# Patient Record
Sex: Male | Born: 1995 | Hispanic: Yes | Marital: Single | Smoking: Never smoker
Health system: Southern US, Community
[De-identification: ages and names within clinical notes are randomized; demographics above are authoritative.]

---
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
## 2020-11-19 ENCOUNTER — Ambulatory Visit: Payer: Medicaid Other

## 2020-11-26 ENCOUNTER — Ambulatory Visit: Payer: Medicaid Other

## 2020-11-29 ENCOUNTER — Other Ambulatory Visit: Payer: Self-pay

## 2020-11-29 ENCOUNTER — Encounter: Payer: Self-pay | Admitting: Physician Assistant

## 2020-11-29 ENCOUNTER — Ambulatory Visit: Payer: Self-pay | Admitting: Physician Assistant

## 2020-11-29 DIAGNOSIS — Z113 Encounter for screening for infections with a predominantly sexual mode of transmission: Secondary | ICD-10-CM

## 2020-11-29 DIAGNOSIS — Z202 Contact with and (suspected) exposure to infections with a predominantly sexual mode of transmission: Secondary | ICD-10-CM

## 2020-11-29 LAB — GRAM STAIN

## 2020-11-29 LAB — HM HIV SCREENING LAB: HM HIV Screening: NEGATIVE

## 2020-11-29 MED ORDER — CEFTRIAXONE SODIUM 500 MG IJ SOLR
500.0000 mg | Freq: Once | INTRAMUSCULAR | Status: AC
  Administered 2020-11-29: 500 mg via INTRAMUSCULAR

## 2020-11-29 MED ORDER — DOXYCYCLINE HYCLATE 100 MG PO TABS: 100.0000 mg | ORAL_TABLET | Freq: Two times a day (BID) | ORAL | 0 refills | Status: AC

## 2020-11-29 NOTE — Progress Notes (Signed)
[facility] Department STI clinic/screening visit  Subjective:  Brent Nelson is a 25 y.o. male being seen today for an STI screening visit. The patient reports they do not have symptoms.    Patient has the following medical conditions:  There are no problems to display for this patient.    Chief Complaint  Patient presents with   SEXUALLY TRANSMITTED DISEASE    screening    HPI  Patient reports patient states that he is not having any symptoms but is a contact to Gonorrhea.  Denies chronic conditions, surgeries and regular medicines.  Reports last HIV test was in 2020 and last void prior to sample collection for Gram stain was about 1.5 hr  ago.    Screening for MPX risk: Does the patient have an unexplained rash? No Is the patient MSM? No Does the patient endorse multiple sex partners or anonymous sex partners? No Did the patient have close or sexual contact with a person diagnosed with MPX? No Has the patient traveled outside the [place] where MPX is endemic? No Is there a high clinical suspicion for MPX-- evidenced by one of the following No  -Unlikely to be chickenpox  -Lymphadenopathy  -Rash that present in same phase of evolution on any given body part   See flowsheet for further details and programmatic requirements.    The following portions of the patient's history were reviewed and updated as appropriate: allergies, current medications, past medical history, past social history, past surgical history and problem list.  Objective:  There were no vitals filed for this visit.  Physical Exam Constitutional:      General: He is not in acute distress.    Appearance: Normal appearance.  HENT:     Head: Normocephalic and atraumatic.     Comments: No nits,lice, or hair loss. No cervical, supraclavicular or axillary adenopathy.     Mouth/Throat:     Mouth: Mucous membranes are moist.     Pharynx: Oropharynx is clear. No oropharyngeal exudate or  posterior oropharyngeal erythema.  Eyes:     Conjunctiva/sclera: Conjunctivae normal.  Pulmonary:     Effort: Pulmonary effort is normal.  Abdominal:     Palpations: Abdomen is soft. There is no mass.     Tenderness: There is no abdominal tenderness. There is no guarding or rebound.  Genitourinary:    Penis: Normal.      Testes: Normal.     Comments: Pubic area without nits, lice, hair loss, edema, erythema, lesions and inguinal adenopathy. Penis circumcised without rash, lesions and discharge at meatus. Testicles descended bilaterally,nt, no masses or edema.  Musculoskeletal:     Cervical back: Neck supple. No tenderness.  Skin:    General: Skin is warm and dry.     Findings: No bruising, erythema, lesion or rash.  Neurological:     Mental Status: He is alert and oriented to person, place, and time.  Psychiatric:        Mood and Affect: Mood normal.        Behavior: Behavior normal.        Thought Content: Thought content normal.        Judgment: Judgment normal.      Assessment and Plan:  Puneet Masoner is a 25 y.o. male presenting to the [facility] Department for STI screening  1. Screening for STD (sexually transmitted disease) Patient into clinic without symptoms. Reviewed with patient Gram stain results.  Rec condoms with all  sex. Await test results.  Counseled that RN will call if needs to RTC for treatment once results are back.  - Gram stain - Gonococcus culture - HIV [facility] - Syphilis Serology, [facility]  2. Gonorrhea contact Treat as a contact to [facility] and cover for Chlamydia with Ceftriaxone 500 mg IM and Doxycycline 100 mg #14 1 po BID for 7 days. No sex for 14 days and until after partner completes treatment. Call with questions or concerns.  - cefTRIAXone (ROCEPHIN) injection 500 mg - doxycycline (VIBRA-TABS) 100 MG tablet; Take 1 tablet (100 mg total) by mouth 2 (two) times daily for 7 days.  Dispense: 14 tablet; Refill:  0     No follow-ups on file.  No future appointments.  Matt Holmes, PA

## 2020-12-01 NOTE — Progress Notes (Signed)
Chart reviewed by Pharmacist  Suzanne Walker PharmD, Contract Pharmacist at [facility]  

## 2020-12-04 LAB — GONOCOCCUS CULTURE

## 2022-01-06 ENCOUNTER — Ambulatory Visit: Payer: Medicaid Other

## 2022-10-22 ENCOUNTER — Ambulatory Visit: Payer: Medicaid Other

## 2023-01-20 ENCOUNTER — Ambulatory Visit: Payer: Medicaid Other | Admitting: Family Medicine

## 2023-01-20 DIAGNOSIS — Z113 Encounter for screening for infections with a predominantly sexual mode of transmission: Secondary | ICD-10-CM

## 2023-01-20 LAB — HM HIV SCREENING LAB: HM HIV Screening: NEGATIVE

## 2023-01-20 NOTE — Progress Notes (Signed)
Pt is here for STD screening. Patient declined condoms and opportunity given to ask questions for any clarifications. Sonda Primes, RN.

## 2023-01-20 NOTE — Progress Notes (Signed)
[facility] Department STI clinic/screening visit  Subjective:  Brent Nelson is a 27 y.o. male being seen today for an STI screening visit. The patient reports they do not have symptoms.    Patient has the following medical conditions:  There are no problems to display for this patient.    Chief Complaint  Patient presents with   SEXUALLY TRANSMITTED DISEASE    Pt is here for STD screening and has no symptoms    HPI  Patient reports  to clinic for STI testing  Last HIV test per patient/review of record was  Lab Results  Component Value Date   HMHIVSCREEN Negative - Validated 11/29/2020   No results found for: "HIV"  Last HEPC test per patient/review of record was No results found for: "HMHEPCSCREEN" No components found for: "HEPC"   Last HEPB test per patient/review of record was No components found for: "HMHEPBSCREEN" No components found for: "HEPC"   Does the patient or their partner desires a pregnancy in the next year? No  Screening for MPX risk: Does the patient have an unexplained rash? No Is the patient MSM? No Does the patient endorse multiple sex partners or anonymous sex partners? No Did the patient have close or sexual contact with a person diagnosed with MPX? No Has the patient traveled outside the [place] where MPX is endemic? No Is there a high clinical suspicion for MPX-- evidenced by one of the following No  -Unlikely to be chickenpox  -Lymphadenopathy  -Rash that present in same phase of evolution on any given body part   See flowsheet for further details and programmatic requirements.   Immunization History  Administered Date(s) Administered   Hepatitis B, PED/ADOLESCENT Jun 21, 1995, 01/02/1997, 05/02/1997   MMR 11/06/1997, 11/08/2000   Varicella 05/14/1998     The following portions of the patient's history were reviewed and updated as appropriate: allergies, current medications, past medical history, past social history, past  surgical history and problem list.  Objective:  There were no vitals filed for this visit.  Physical Exam Vitals and nursing note reviewed.  Constitutional:      Appearance: Normal appearance.  HENT:     Head: Normocephalic and atraumatic.     Mouth/Throat:     Mouth: Mucous membranes are moist.     Pharynx: No oropharyngeal exudate or posterior oropharyngeal erythema.  Eyes:     General:        Right eye: No discharge.        Left eye: No discharge.     Conjunctiva/sclera:     Right eye: Right conjunctiva is not injected. No exudate.    Left eye: Left conjunctiva is not injected. No exudate. Pulmonary:     Effort: Pulmonary effort is normal.  Abdominal:     General: Abdomen is flat.     Palpations: Abdomen is soft. There is no hepatomegaly or mass.     Tenderness: There is no abdominal tenderness. There is no rebound.  Genitourinary:    Comments: Declined genital exam- asymptomatic Lymphadenopathy:     Cervical: No cervical adenopathy.     Upper Body:     Right upper body: No supraclavicular or axillary adenopathy.     Left upper body: No supraclavicular or axillary adenopathy.  Skin:    General: Skin is warm and dry.  Neurological:     Mental Status: He is alert and oriented to person, place, and time.       Assessment and Plan:  Brent Nelson is a 27 y.o. male presenting to the [facility] Department for STI screening  1. Screening for venereal disease  - HIV [facility] - Syphilis Serology, [facility] - Chlamydia/GC NAA, Confirmation   Patient does not have STI symptoms Patient accepted all screenings including  urine GC/Chlamydia, and blood work for HIV/Syphilis. Patient meets criteria for HepB screening? No. Ordered? not applicable Patient meets criteria for HepC screening? No. Ordered? not applicable Recommended condom use with all sex Discussed importance of condom use for STI prevent  Treat positive test results per standing  order. Discussed time line for [facility] results and that patient will be called with positive results and encouraged patient to call if he had not heard in 2 weeks Recommended repeat testing in 3 months with positive results. Recommended returning for continued or worsening symptoms.   Return if symptoms worsen or fail to improve, for STI screening.  Future Appointments  Date Time Provider Department Center  01/20/2023  3:10 PM Lenice Llamas, FNP [facility] None    Lenice Llamas, [place]

## 2023-01-24 LAB — CHLAMYDIA/GC NAA, CONFIRMATION
Chlamydia trachomatis, NAA: NEGATIVE
Neisseria gonorrhoeae, NAA: NEGATIVE
